# Patient Record
Sex: Female | Born: 2000 | Race: White | Hispanic: No | Marital: Single | Smoking: Never smoker
Health system: Southern US, Community
[De-identification: ages and names within clinical notes are randomized; demographics above are authoritative.]

## PROBLEM LIST (undated history)

## (undated) DIAGNOSIS — J45909 Unspecified asthma, uncomplicated: Secondary | ICD-10-CM

---
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
## 2021-01-01 DIAGNOSIS — Z419 Encounter for procedure for purposes other than remedying health state, unspecified: Secondary | ICD-10-CM | POA: Diagnosis not present

## 2021-01-29 DIAGNOSIS — Z419 Encounter for procedure for purposes other than remedying health state, unspecified: Secondary | ICD-10-CM | POA: Diagnosis not present

## 2021-03-01 DIAGNOSIS — Z419 Encounter for procedure for purposes other than remedying health state, unspecified: Secondary | ICD-10-CM | POA: Diagnosis not present

## 2021-03-31 DIAGNOSIS — Z419 Encounter for procedure for purposes other than remedying health state, unspecified: Secondary | ICD-10-CM | POA: Diagnosis not present

## 2021-05-01 DIAGNOSIS — Z419 Encounter for procedure for purposes other than remedying health state, unspecified: Secondary | ICD-10-CM | POA: Diagnosis not present

## 2021-05-31 DIAGNOSIS — Z419 Encounter for procedure for purposes other than remedying health state, unspecified: Secondary | ICD-10-CM | POA: Diagnosis not present

## 2021-06-28 DIAGNOSIS — B373 Candidiasis of vulva and vagina: Secondary | ICD-10-CM | POA: Diagnosis not present

## 2021-07-01 DIAGNOSIS — Z419 Encounter for procedure for purposes other than remedying health state, unspecified: Secondary | ICD-10-CM | POA: Diagnosis not present

## 2021-07-08 ENCOUNTER — Other Ambulatory Visit: Payer: Self-pay

## 2021-07-08 ENCOUNTER — Emergency Department
Admission: EM | Admit: 2021-07-08 | Discharge: 2021-07-08 | Disposition: A | Discharge status: Home / Self Care | Payer: Medicaid Other | Attending: Emergency Medicine | Admitting: Emergency Medicine

## 2021-07-08 DIAGNOSIS — Z5321 Procedure and treatment not carried out due to patient leaving prior to being seen by health care provider: Secondary | ICD-10-CM | POA: Diagnosis not present

## 2021-07-08 DIAGNOSIS — J029 Acute pharyngitis, unspecified: Secondary | ICD-10-CM | POA: Insufficient documentation

## 2021-07-08 DIAGNOSIS — R509 Fever, unspecified: Secondary | ICD-10-CM | POA: Diagnosis present

## 2021-07-08 NOTE — ED Notes (Signed)
Pt up to desk stating she does not want to wait any longer, wants to go to UC in the morning. Discussed with pt she may return at any point if she wishes. Seen ambulatory through lobby doors.

## 2021-07-08 NOTE — ED Notes (Signed)
Pt refusing tylenol at this time due to throat being too uncomfortable to swallow.

## 2021-07-08 NOTE — ED Triage Notes (Signed)
Pt states starting yesterday she noticed swelling to her right tonsil and has a sore throat, pt states seh has also had a fever at home. Pt denies cough or shortness of breath. Denies any known sick contacts.

## 2021-08-01 DIAGNOSIS — Z419 Encounter for procedure for purposes other than remedying health state, unspecified: Secondary | ICD-10-CM | POA: Diagnosis not present

## 2021-08-31 DIAGNOSIS — Z419 Encounter for procedure for purposes other than remedying health state, unspecified: Secondary | ICD-10-CM | POA: Diagnosis not present

## 2021-10-01 DIAGNOSIS — Z419 Encounter for procedure for purposes other than remedying health state, unspecified: Secondary | ICD-10-CM | POA: Diagnosis not present

## 2021-10-18 DIAGNOSIS — M791 Myalgia, unspecified site: Secondary | ICD-10-CM | POA: Diagnosis not present

## 2021-10-18 DIAGNOSIS — A084 Viral intestinal infection, unspecified: Secondary | ICD-10-CM | POA: Diagnosis not present

## 2021-10-22 DIAGNOSIS — R300 Dysuria: Secondary | ICD-10-CM | POA: Diagnosis not present

## 2021-10-31 DIAGNOSIS — Z419 Encounter for procedure for purposes other than remedying health state, unspecified: Secondary | ICD-10-CM | POA: Diagnosis not present

## 2021-11-10 ENCOUNTER — Encounter: Payer: Self-pay | Admitting: Emergency Medicine

## 2021-11-10 ENCOUNTER — Emergency Department
Admission: EM | Admit: 2021-11-10 | Discharge: 2021-11-10 | Disposition: A | Discharge status: Home / Self Care | Payer: Medicaid Other | Attending: Emergency Medicine | Admitting: Emergency Medicine

## 2021-11-10 ENCOUNTER — Other Ambulatory Visit: Payer: Self-pay

## 2021-11-10 ENCOUNTER — Emergency Department: Payer: Medicaid Other

## 2021-11-10 DIAGNOSIS — M795 Residual foreign body in soft tissue: Secondary | ICD-10-CM

## 2021-11-10 DIAGNOSIS — J45909 Unspecified asthma, uncomplicated: Secondary | ICD-10-CM | POA: Insufficient documentation

## 2021-11-10 DIAGNOSIS — Z975 Presence of (intrauterine) contraceptive device: Secondary | ICD-10-CM | POA: Diagnosis not present

## 2021-11-10 MED ORDER — HYDRALAZINE HCL 50 MG PO TABS: 50.0000 mg | ORAL_TABLET | Freq: Once | ORAL | Status: DC

## 2021-11-10 NOTE — ED Provider Notes (Signed)
[facility] Emergency Department Provider Note   ____________________________________________    I have reviewed the triage vital signs and the nursing notes.   HISTORY  Chief Complaint Foreign Body     HPI Summer Boyd is a 20 y.o. female patient reports she has an Implanon in her left upper extremity, she reports pain in that area today and is concerned that it is broken.  She denies trauma to the area.  Was implanted by the health department.  She reports it is sore, no redness, no bleeding.  Past Medical History:  Diagnosis Date   Asthma     Patient Active Problem List   Diagnosis Date Noted   Asthma 09/09/2019    History reviewed. No pertinent surgical history.  Prior to Admission medications   Medication Sig Start Date End Date Taking? Authorizing Provider  cyclobenzaprine (FLEXERIL) 5 MG tablet Take 1 tablet (5 mg total) by mouth 3 (three) times daily as needed for muscle spasms. Patient not taking: Reported on 09/08/2019 06/28/16   Joni Reining, PA-C  ibuprofen (ADVIL,MOTRIN) 600 MG tablet Take 1 tablet (600 mg total) by mouth every 6 (six) hours as needed for moderate pain. Patient not taking: Reported on 09/08/2019 02/11/17   Evon Slack, PA-C     Allergies Patient has no known allergies.  No family history on file.  Social History Social History   Tobacco Use   Smoking status: Never   Smokeless tobacco: Never  Vaping Use   Vaping Use: Every day  Substance Use Topics   Alcohol use: Yes   Drug use: No    Review of Systems  Constitutional: No fever/chills   Musculoskeletal: As above Skin: Negative for rash.     ____________________________________________   PHYSICAL EXAM:  VITAL SIGNS: ED Triage Vitals  Enc Vitals Group     BP 11/10/21 1708 112/70     Pulse Rate 11/10/21 1708 (!) 112     Resp 11/10/21 1708 18     Temp 11/10/21 1708 98.4 F (36.9 C)     Temp Source 11/10/21 1708 Oral     SpO2  11/10/21 1708 100 %     Weight 11/10/21 1709 58.5 kg (129 lb)     Height 11/10/21 1709 1.6 m (5\' 3" )     Head Circumference --      Peak Flow --      Pain Score 11/10/21 1709 6     Pain Loc --      Pain Edu? --      Excl. in GC? --      Constitutional: Alert and oriented. No acute distress. Pleasant and interactive a. Mouth/Throat: Mucous membranes are moist.   Cardiovascular: Normal rate, regular rhythm.  Respiratory: Normal respiratory effort.  No retractions. Genitourinary: deferred Musculoskeletal: Implanon palpated, does not appear fractured nor abnormal on exam.,  Normal range of motion of the upper extremity, warm and well perfused Neurologic:  Normal speech and language. No gross focal neurologic deficits are appreciated.   Skin:  Skin is warm, dry and intact. No rash noted.   ____________________________________________   LABS (all labs ordered are listed, but only abnormal results are displayed)  Labs Reviewed - No data to display ____________________________________________  EKG   ____________________________________________  RADIOLOGY  Ultrasound reviewed by me, no evidence of abnormality with Implanon ____________________________________________   PROCEDURES  Procedure(s) performed: No  Procedures   Critical Care performed: No ____________________________________________   INITIAL IMPRESSION / ASSESSMENT AND  PLAN / ED COURSE  Pertinent labs & imaging results that were available during my care of the patient were reviewed by me and considered in my medical decision making (see chart for details).   Patient well-appearing and in no acute distress, reassuring exam, recommend follow-up with health department if she wants Implanon removed   ____________________________________________   FINAL CLINICAL IMPRESSION(S) / ED DIAGNOSES  Final diagnoses:  Foreign body (FB) in soft tissue      NEW MEDICATIONS STARTED DURING THIS  VISIT:  Discharge Medication List as of 11/10/2021  6:10 PM       Note:  This document was prepared using Dragon voice recognition software and may include unintentional dictation errors.    Jene Every, MD 11/10/21 [phone]

## 2021-11-10 NOTE — Discharge Instructions (Addendum)
Your implanon did not appear broken on ultrasound

## 2021-11-10 NOTE — ED Triage Notes (Signed)
Pt in via POV, reports noticing today that her Nexplenon Implant is broken in half; reports pain to upper left arm, no swelling noted in triage.  NAD noted at this time.

## 2021-11-10 NOTE — ED Provider Notes (Signed)
  Emergency Medicine Provider Triage Evaluation Note  Summer Boyd , a 20 y.o.female,  was evaluated in triage.  Pt complains of Nexplanon complication.  Patient states that she believes that her Nexplanon  broke in half.  She is currently endorsing severe pain in her left upper extremity.  Tachycardic at 112.  No other symptoms at this time   Review of Systems  Positive: Left upper extremity pain. Negative: Denies fever, chest pain, vomiting  Physical Exam   Vitals:   11/10/21 1708  BP: 112/70  Pulse: (!) 112  Resp: 18  Temp: 98.4 F (36.9 C)  SpO2: 100%   Gen:   Awake, appears to be in pain Resp:  Normal effort  MSK:   Moves extremities without difficulty  Other:    Medical Decision Making  Given the patient's initial medical screening exam, the following diagnostic evaluation has been ordered. The patient will be placed in the appropriate treatment space, once one is available, to complete the evaluation and treatment. I have discussed the plan of care with the patient and I have advised the patient that an ED physician or mid-level practitioner will reevaluate their condition after the test results have been received, as the results may give them additional insight into the type of treatment they may need.    Diagnostics: Ultrasound soft tissue left upper extremity  Treatments: none immediately    Varney Daily, PA 11/10/21 1719    Merwyn Katos, MD 11/10/21 1721

## 2021-11-11 ENCOUNTER — Telehealth: Payer: Self-pay | Admitting: Family Medicine

## 2021-11-11 NOTE — Telephone Encounter (Signed)
Pt states shes in pain from the nexplanon

## 2021-11-11 NOTE — Telephone Encounter (Signed)
Pt thinks that her Nexplanon is broken and she is having pain at the site.   Pt was seen at the ER on 12/11 and an Ultrasound was ordered.  The Nexplanon is not broken.  Per Providers, pt informed to make an appointment.

## 2021-11-13 DIAGNOSIS — J069 Acute upper respiratory infection, unspecified: Secondary | ICD-10-CM | POA: Diagnosis not present

## 2021-11-13 DIAGNOSIS — Z20822 Contact with and (suspected) exposure to covid-19: Secondary | ICD-10-CM | POA: Diagnosis not present

## 2021-11-20 ENCOUNTER — Ambulatory Visit: Payer: Medicaid Other

## 2021-11-28 ENCOUNTER — Ambulatory Visit: Payer: Medicaid Other | Admitting: Nurse Practitioner

## 2021-11-28 ENCOUNTER — Ambulatory Visit: Payer: Medicaid Other

## 2021-11-28 ENCOUNTER — Other Ambulatory Visit: Payer: Self-pay

## 2021-11-28 ENCOUNTER — Ambulatory Visit (LOCAL_COMMUNITY_HEALTH_CENTER): Payer: Medicaid Other | Admitting: Nurse Practitioner

## 2021-11-28 VITALS — BP 104/68 | Ht 64.0 in | Wt 130.8 lb

## 2021-11-28 DIAGNOSIS — Z3009 Encounter for other general counseling and advice on contraception: Secondary | ICD-10-CM | POA: Diagnosis not present

## 2021-11-28 DIAGNOSIS — Z30011 Encounter for initial prescription of contraceptive pills: Secondary | ICD-10-CM

## 2021-11-28 DIAGNOSIS — F419 Anxiety disorder, unspecified: Secondary | ICD-10-CM | POA: Insufficient documentation

## 2021-11-28 MED ORDER — NORGESTIM-ETH ESTRAD TRIPHASIC 0.18/0.215/0.25 MG-35 MCG PO TABS: 1.0000 | ORAL_TABLET | Freq: Every day | ORAL | 2 refills | Status: AC

## 2021-11-29 NOTE — Progress Notes (Signed)
Nexplanon Removal ?Patient identified, informed consent performed, consent signed.   Appropriate time out taken. Nexplanon site identified.  Area prepped in usual sterile fashon. 3 ml of 1% lidocaine with Epinephrine was used to anesthetize the area at the distal end of the implant and along implant site. A small stab incision was made right beside the implant on the distal portion.  The Nexplanon rod was grasped using hemostats/manual and removed without difficulty.  There was minimal blood loss. There were no complications.  Steri-strips were applied over the small incision.  A pressure bandage was applied to reduce any bruising.  The patient tolerated the procedure well and was given post procedure instructions.   Rennae Ferraiolo, FNP  ?

## 2021-11-29 NOTE — Progress Notes (Deleted)
[facility] DEPARTMENT [facility] [address] Main Number: [phone]    Family Planning Visit- Initial Visit  Subjective:  Summer Boyd is a 20 y.o.  [id number]   being seen today for an initial annual visit and to discuss reproductive life planning.  The patient is currently using Hormonal Implant for pregnancy prevention. Patient reports   does not want a pregnancy in the next year.  Patient has the following medical conditions has Asthma and Anxiety on their problem list.  Chief Complaint  Patient presents with   Contraception    Patient reports to clinic today for a physical and Nexplanon removal.  Patient states that she suffers from anxiety and as a coping mechanism she continuously touches and bends the Nexplanon device in her arm.  Device at times is sore and causes some discomfort.  Patient had an ED visit on 11/10/21 to have a U/S of her arm to see if the Nexplanon device was broken.  Per U/S report on 11/10/21 the Nexplanon device was intact.  Patient desires current device to be removed and a new one to be placed.    Patient denies signs and symptoms and declines all blood work.    Body mass index is 22.45 kg/m. - Patient is eligible for diabetes screening based on BMI and age >30?  not applicable HA1C ordered? not applicable  Patient reports 1  partner/s in last year. Desires STI screening?  No - patient declines  Has patient been screened once for HCV in the past?  No  No results found for: HCVAB  Does the patient have current drug use (including MJ), have a partner with drug use, and/or has been incarcerated since last result? No  If yes-- Screen for HCV through [facility]   Does the patient meet criteria for HBV testing? No  Criteria:  -Household, sexual or needle sharing contact with HBV -History of drug use -HIV positive -Those with known Hep C   Health Maintenance Due  Topic Date Due   Pneumococcal Vaccine 69-17  Years old (1 - PCV) Never done   HPV VACCINES (1 - 2-dose series) Never done   CHLAMYDIA SCREENING  Never done   HIV Screening  Never done   Hepatitis C Screening  Never done   INFLUENZA VACCINE  07/01/2021    Review of Systems  Constitutional:  Negative for chills, fever, malaise/fatigue and weight loss.  HENT:  Negative for congestion, hearing loss and sore throat.   Eyes:  Negative for blurred vision, double vision and photophobia.  Respiratory:  Negative for shortness of breath.   Cardiovascular:  Negative for chest pain.  Gastrointestinal:  Negative for abdominal pain, blood in stool, constipation, diarrhea, heartburn, nausea and vomiting.  Genitourinary:  Negative for dysuria and frequency.  Musculoskeletal:  Negative for back pain, joint pain and neck pain.  Skin:  Negative for itching and rash.  Neurological:  Negative for dizziness, weakness and headaches.  Endo/Heme/Allergies:  Does not bruise/bleed easily.  Psychiatric/Behavioral:  Negative for depression, substance abuse and suicidal ideas.    The following portions of the patient's history were reviewed and updated as appropriate: allergies, current medications, past family history, past medical history, past social history, past surgical history and problem list. Problem list updated.   See flowsheet for other program required questions.  Objective:   Vitals:   11/29/21 1404  BP: 104/68  Weight: 130 lb 12.8 oz (59.3 kg)  Height: 5\' 4"  (1.626  m)    Physical Exam Vitals and nursing note reviewed.  Constitutional:      Appearance: Normal appearance.  HENT:     Head: Normocephalic and atraumatic.     Right Ear: Tympanic membrane normal.     Left Ear: Tympanic membrane normal.     Mouth/Throat:     Mouth: Mucous membranes are moist.     Pharynx: No oropharyngeal exudate or posterior oropharyngeal erythema.     Comments: No visible dental caries.  Tonsils enlarged bilaterally.  Eyes:     General: No scleral  icterus.    Pupils: Pupils are equal, round, and reactive to light.  Cardiovascular:     Rate and Rhythm: Normal rate and regular rhythm.     Pulses: Normal pulses.  Pulmonary:     Effort: Pulmonary effort is normal.     Breath sounds: Normal breath sounds.  Abdominal:     General: Abdomen is flat. Bowel sounds are normal.     Palpations: Abdomen is soft.  Musculoskeletal:        General: Normal range of motion.     Cervical back: Full passive range of motion without pain and normal range of motion.  Skin:    General: Skin is warm and dry.  Neurological:     General: No focal deficit present.     Mental Status: She is alert and oriented to person, place, and time.  Psychiatric:        Behavior: Behavior is cooperative.      Assessment and Plan:  ARYN SAFRAN is a 20 y.o. female presenting to the [facility] Department for an initial annual wellness/contraceptive visit  Contraception counseling: Reviewed all forms of birth control options in the tiered based approach. available including abstinence; over the counter/barrier methods; hormonal contraceptive medication including pill, patch, ring, injection,contraceptive implant, ECP; hormonal and nonhormonal IUDs; permanent sterilization options including vasectomy and the various tubal sterilization modalities. Risks, benefits, and typical effectiveness rates were reviewed.  Questions were answered.  Written information was also given to the patient to review.  Patient desires Oral Contraceptive, this was prescribed for patient.    The patient will follow up in  1 year for surveillance.  The patient was told to call with any further questions, or with any concerns about this method of contraception.  Emphasized use of condoms 100% of the time for STI prevention.  Patient was not offered ECP based on current use of contraception.    There are no diagnoses linked to this encounter.    Return in about 1 year (around  11/28/2022).  No future appointments.  Glenna Fellows, FNP

## 2021-11-29 NOTE — Progress Notes (Signed)
[facility] DEPARTMENT [facility] [address] Main Number: [phone]    Family Planning Visit- Initial Visit  Subjective:  Summer Boyd is a 20 y.o.  [id number]   being seen today for an initial annual visit and to discuss reproductive life planning.  The patient is currently using Hormonal Implant for pregnancy prevention. Patient reports   does not want a pregnancy in the next year.  Patient has the following medical conditions has Asthma and Anxiety on their problem list.  Chief Complaint  Patient presents with   Contraception    PE, Nexplanon removal and reinsertion       Patient reports to clinic today for a physical and Nexplanon removal.  Patient states that she suffers from anxiety and as a coping mechanism she continuously touches and bends the Nexplanon device in her arm.  Device at times is sore and causes some discomfort.  Patient had an ED visit on 11/10/21 to have a U/S of her arm to see if the Nexplanon device was broken.  Per U/S report on 11/10/21 the Nexplanon device was intact.  Patient desires current device to be removed and a new one to be placed.    Patient denies any other signs and symptoms.  Declines blood work today and STD screening.    Body mass index is 22.45 kg/m. - Patient is eligible for diabetes screening based on BMI and age >45?  not applicable HA1C ordered? not applicable  Patient reports 1  partner/s in last year. Desires STI screening?  No - patient declines   Has patient been screened once for HCV in the past?  No  No results found for: HCVAB  Does the patient have current drug use (including MJ), have a partner with drug use, and/or has been incarcerated since last result? No  If yes-- Screen for HCV through [facility]   Does the patient meet criteria for HBV testing? No  Criteria:  -Household, sexual or needle sharing contact with HBV -History of drug use -HIV positive -Those with known Hep  C   Health Maintenance Due  Topic Date Due   Pneumococcal Vaccine 27-11 Years old (1 - PCV) Never done   HPV VACCINES (1 - 2-dose series) Never done   CHLAMYDIA SCREENING  Never done   HIV Screening  Never done   Hepatitis C Screening  Never done   INFLUENZA VACCINE  07/01/2021    Review of Systems  Constitutional:  Negative for chills, fever, malaise/fatigue and weight loss.  HENT:  Negative for congestion, hearing loss and sore throat.   Eyes:  Negative for blurred vision, double vision and photophobia.  Respiratory:  Negative for shortness of breath.   Cardiovascular:  Negative for chest pain.  Gastrointestinal:  Negative for abdominal pain, blood in stool, constipation, diarrhea, heartburn, nausea and vomiting.  Genitourinary:  Negative for dysuria and frequency.  Musculoskeletal:  Negative for back pain, joint pain and neck pain.  Skin:  Negative for itching and rash.  Neurological:  Negative for dizziness, weakness and headaches.  Endo/Heme/Allergies:  Does not bruise/bleed easily.  Psychiatric/Behavioral:  Negative for depression, substance abuse and suicidal ideas.    The following portions of the patient's history were reviewed and updated as appropriate: allergies, current medications, past family history, past medical history, past social history, past surgical history and problem list. Problem list updated.   See flowsheet for other program required questions.  Objective:   Vitals:   11/29/21  0940  BP: 104/68  Weight: 130 lb 12.8 oz (59.3 kg)  Height: 5\' 4"  (1.626 m)    Physical Exam Vitals and nursing note reviewed.  Constitutional:      Appearance: Normal appearance.  HENT:     Head: Normocephalic and atraumatic.     Mouth/Throat:     Mouth: Mucous membranes are moist.     Pharynx: No oropharyngeal exudate or posterior oropharyngeal erythema.     Comments: Enlarged tonsils bilaterally.   No dental caries noted.  Eyes:     General: No scleral  icterus.    Pupils: Pupils are equal, round, and reactive to light.  Cardiovascular:     Rate and Rhythm: Normal rate and regular rhythm.     Pulses: Normal pulses.  Pulmonary:     Effort: Pulmonary effort is normal.     Breath sounds: Normal breath sounds.  Abdominal:     General: Abdomen is flat. Bowel sounds are normal.     Palpations: Abdomen is soft.  Musculoskeletal:        General: Normal range of motion.     Cervical back: Full passive range of motion without pain and normal range of motion.  Skin:    General: Skin is warm and dry.  Neurological:     General: No focal deficit present.     Mental Status: She is alert and oriented to person, place, and time.  Psychiatric:        Behavior: Behavior is cooperative.      Assessment and Plan:  Summer Boyd is a 20 y.o. female presenting to the [facility] Department for an initial annual wellness/contraceptive visit  Contraception counseling: Reviewed all forms of birth control options in the tiered based approach. available including abstinence; over the counter/barrier methods; hormonal contraceptive medication including pill, patch, ring, injection,contraceptive implant, ECP; hormonal and nonhormonal IUDs; permanent sterilization options including vasectomy and the various tubal sterilization modalities. Risks, benefits, and typical effectiveness rates were reviewed.  Questions were answered.  Written information was also given to the patient to review.  Patient desires Oral Contraceptive, this was prescribed for patient.    The patient will follow up in  1 year  for surveillance.  The patient was told to call with any further questions, or with any concerns about this method of contraception.  Emphasized use of condoms 100% of the time for STI prevention.  Patient was not offered offered ECP based on current contraception method.     1. Family planning -20 year old female in clinic today for a well woman exam.  Patient desires to switch birth control methods. Desires to have Nexplanon removed today and start birth control pills after further dicussion. See Nexplanon flowsheet and note.    2. Anxiety -Patient given coping strategies to manage anxiety and behavioral health referral submitted today.  - Ambulatory referral to Behavioral Health  3. Encounter for initial prescription of contraceptive pills - Prescri[ption sent to pharmacy for Tri-Sprintec.  Patient advised to use condoms with sex for 2 weeks due to switching to a new method.  Three packs given today.  Advised to return to clinic when on last pack of pills to assess compliance and how method is working for patient.   - Norgestimate-Ethinyl Estradiol Triphasic (TRI-SPRINTEC) 0.18/0.215/0.25 MG-35 MCG tablet; Take 1 tablet by mouth daily.  Dispense: 28 tablet; Refill: 2    Return in about 1 year (around 11/28/2022) for Annual well-woman exam.    11/30/2022,  FNP

## 2021-12-01 DIAGNOSIS — Z419 Encounter for procedure for purposes other than remedying health state, unspecified: Secondary | ICD-10-CM | POA: Diagnosis not present

## 2022-01-01 DIAGNOSIS — Z419 Encounter for procedure for purposes other than remedying health state, unspecified: Secondary | ICD-10-CM | POA: Diagnosis not present

## 2022-01-29 DIAGNOSIS — Z419 Encounter for procedure for purposes other than remedying health state, unspecified: Secondary | ICD-10-CM | POA: Diagnosis not present

## 2022-02-25 DIAGNOSIS — B3731 Acute candidiasis of vulva and vagina: Secondary | ICD-10-CM | POA: Diagnosis not present

## 2022-03-01 DIAGNOSIS — Z419 Encounter for procedure for purposes other than remedying health state, unspecified: Secondary | ICD-10-CM | POA: Diagnosis not present

## 2022-03-31 DIAGNOSIS — Z419 Encounter for procedure for purposes other than remedying health state, unspecified: Secondary | ICD-10-CM | POA: Diagnosis not present

## 2022-05-01 DIAGNOSIS — Z419 Encounter for procedure for purposes other than remedying health state, unspecified: Secondary | ICD-10-CM | POA: Diagnosis not present

## 2022-05-31 DIAGNOSIS — Z419 Encounter for procedure for purposes other than remedying health state, unspecified: Secondary | ICD-10-CM | POA: Diagnosis not present

## 2022-06-07 DIAGNOSIS — L729 Follicular cyst of the skin and subcutaneous tissue, unspecified: Secondary | ICD-10-CM | POA: Diagnosis not present

## 2022-06-07 DIAGNOSIS — R110 Nausea: Secondary | ICD-10-CM | POA: Diagnosis not present

## 2022-06-07 DIAGNOSIS — R630 Anorexia: Secondary | ICD-10-CM | POA: Diagnosis not present

## 2022-06-17 DIAGNOSIS — Z3202 Encounter for pregnancy test, result negative: Secondary | ICD-10-CM | POA: Diagnosis not present

## 2022-06-17 DIAGNOSIS — R197 Diarrhea, unspecified: Secondary | ICD-10-CM | POA: Diagnosis not present

## 2022-06-25 IMAGING — US US EXTREM UP*L* LTD
1 series · 14 of 25 positions shown · non-contrast
Comparison: None.

CLINICAL DATA: Implanted contraceptive device with possible injury

EXAM:
ULTRASOUND LEFT UPPER EXTREMITY LIMITED
TECHNIQUE: Ultrasound examination of the upper extremity soft tissues was
performed in the area of clinical concern.

[Series 1: us soft tissue upper extremity limited left (non-v · 27 acquisitions, 14 frames shown]
[im 1/27]
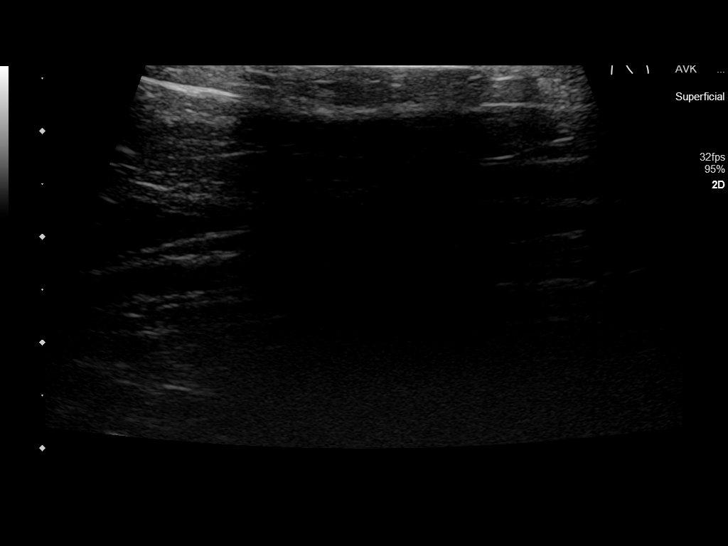
[im 3/27]
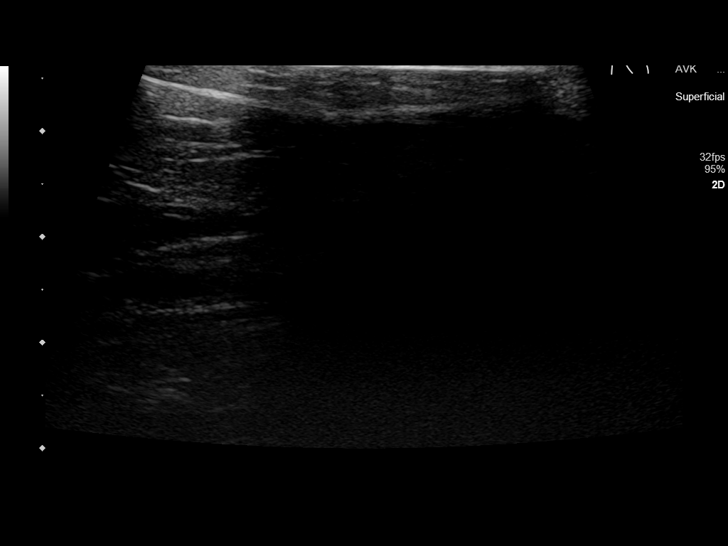
[im 5/27]
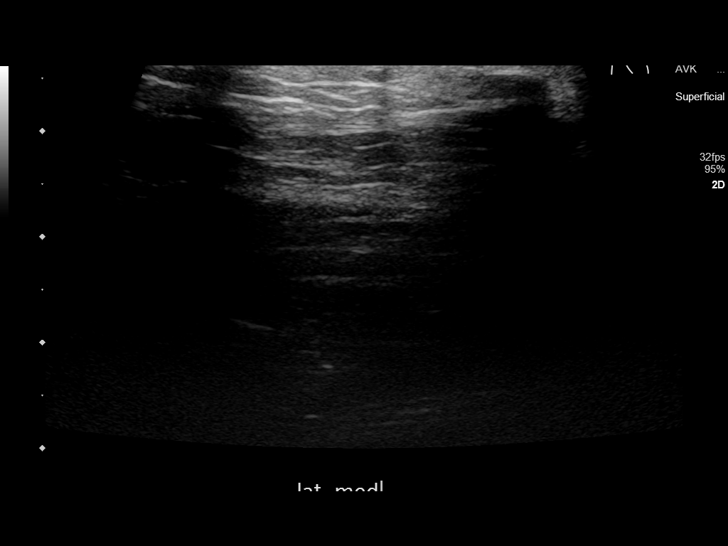
[im 7/27]
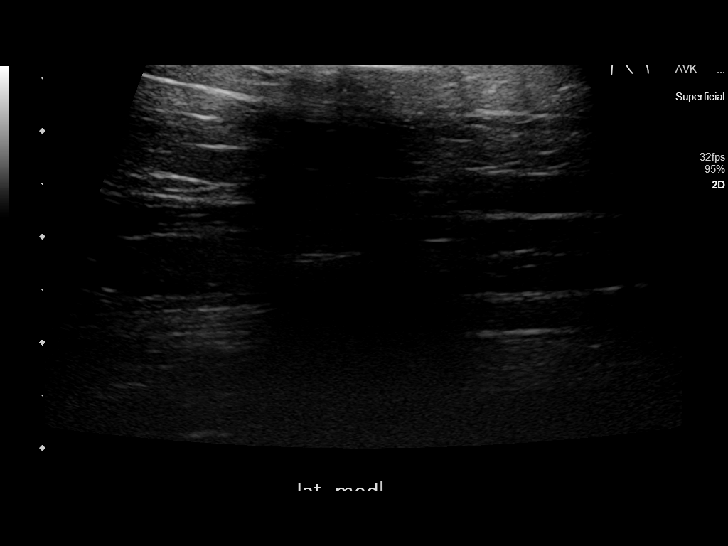
[im 9/27]
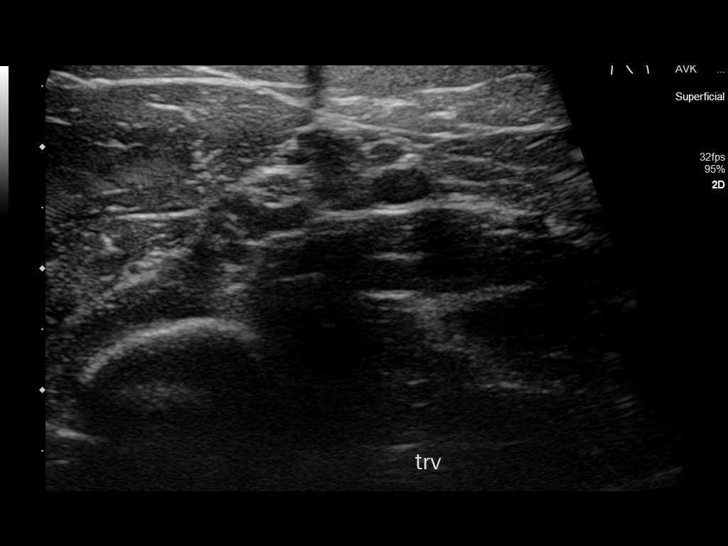
[im 10/27]
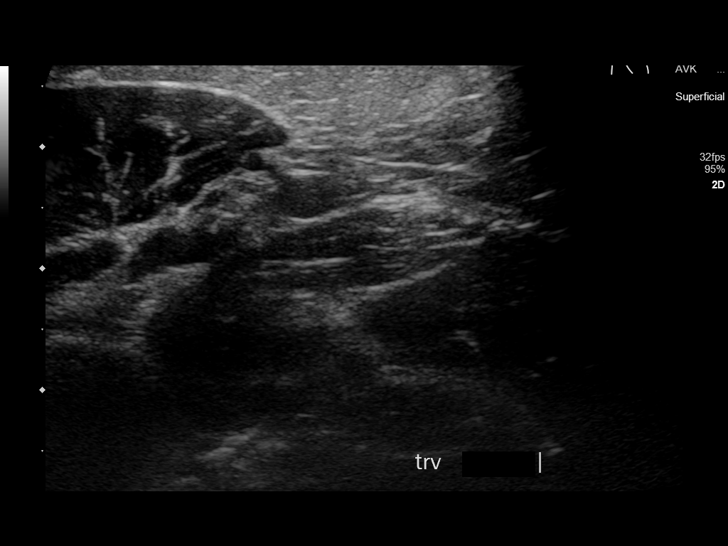
[im 12/27]
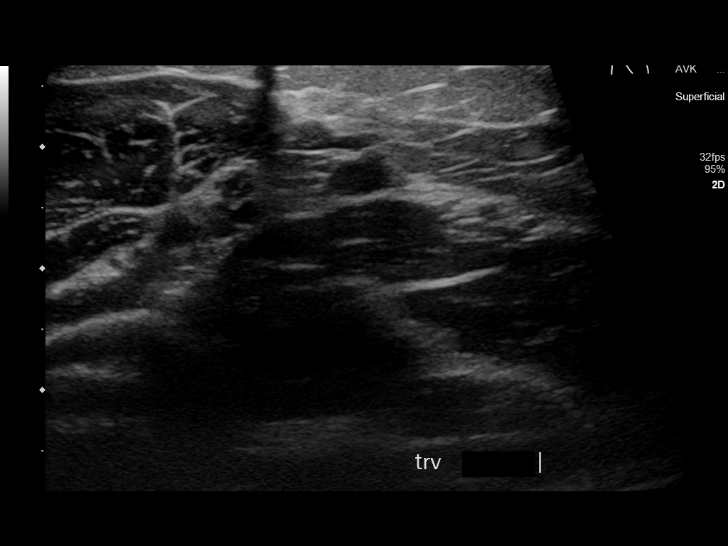
[im 15/27]
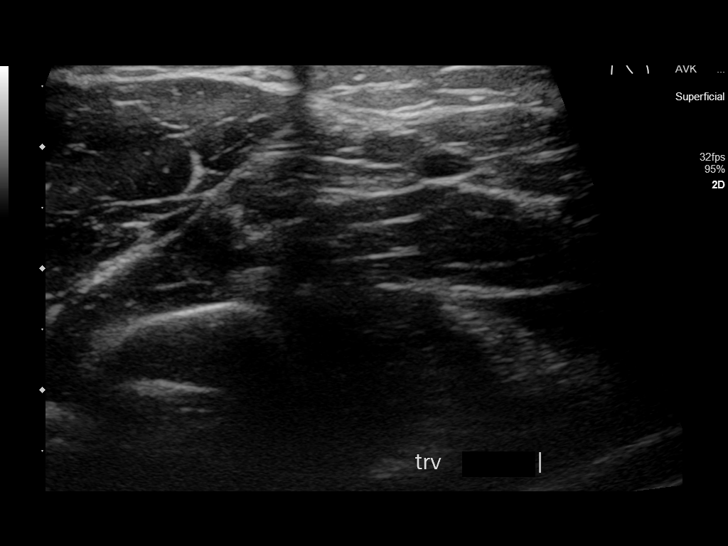
[im 17/27]
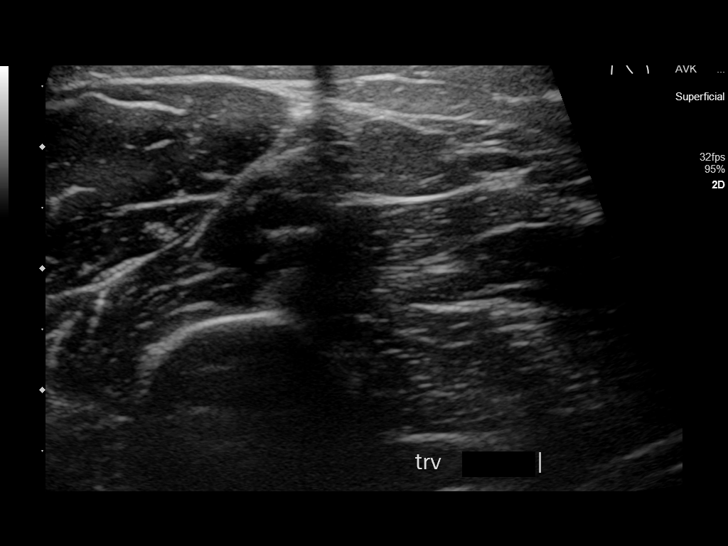
[im 18/27]
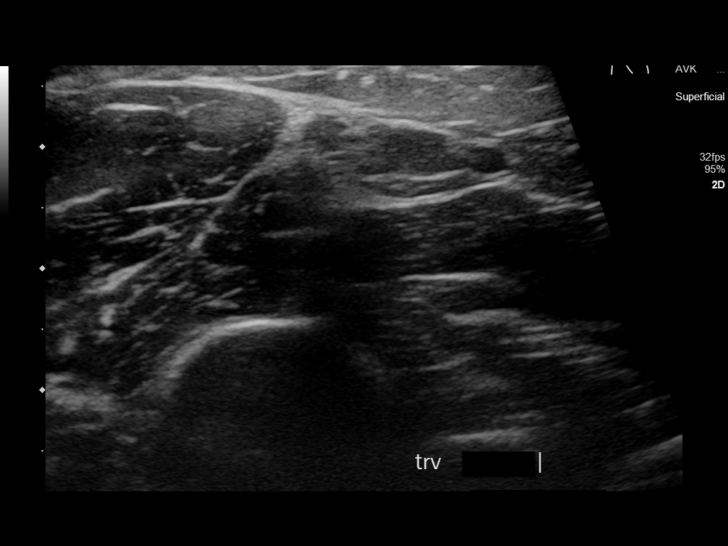
[im 20/27]
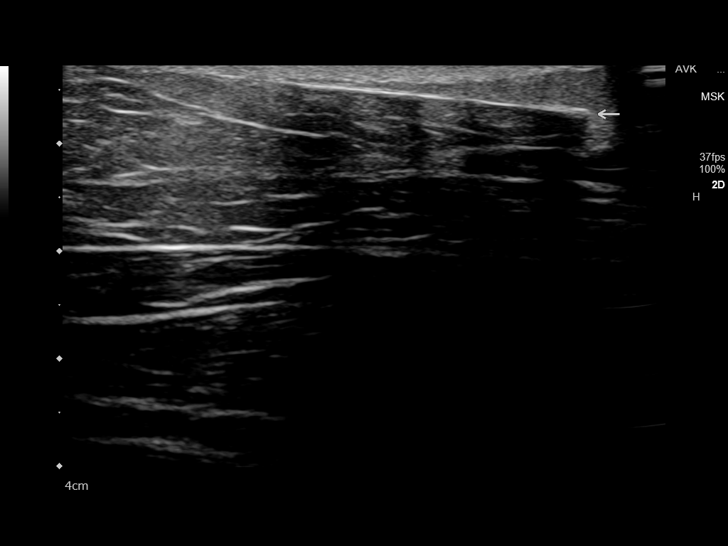
[im 22/27]
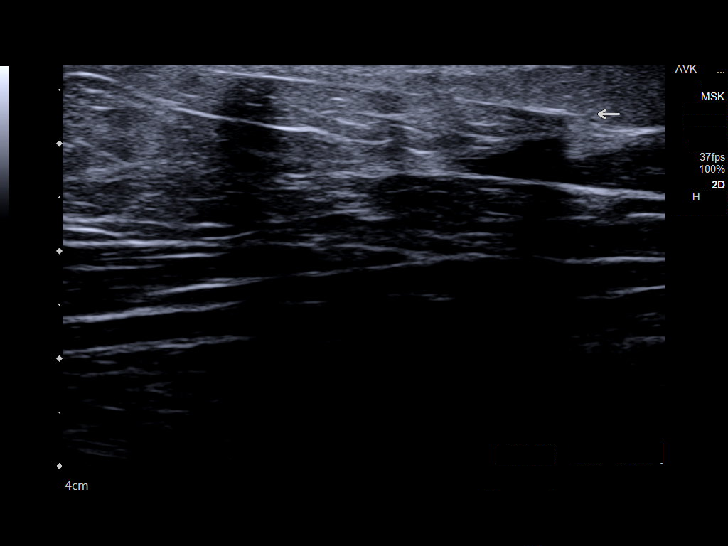
[im 24/27]
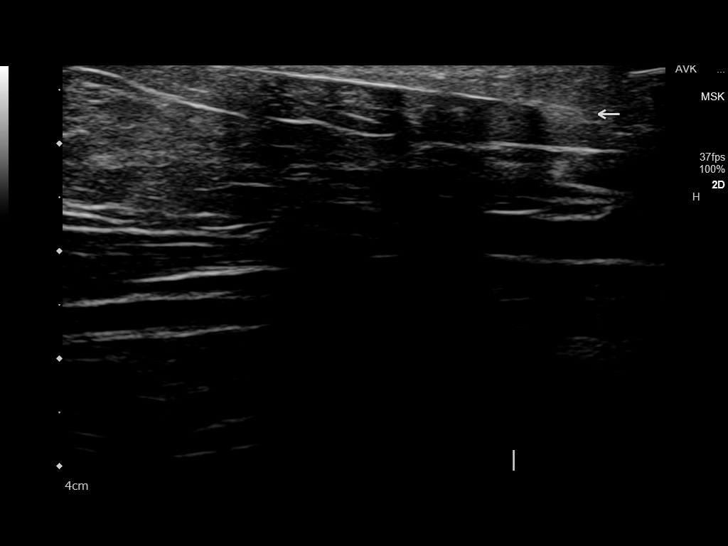
[im 27/27]
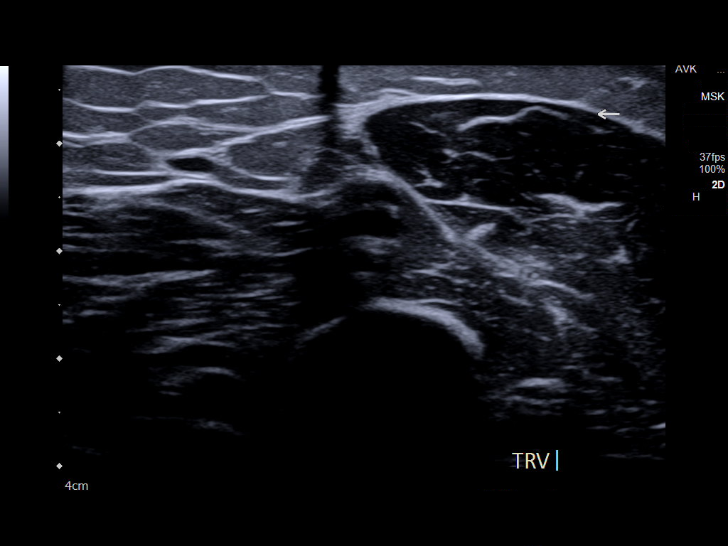

[14 of 25 positions shown; findings below may reference images not displayed]

FINDINGS: Targeted ultrasound was performed of the soft tissues of the left
upper extremity at site of patient's clinical concern. Linear
echogenic structure within the subcutaneous soft tissues at site of
patient's clinical concern compatible with implanted contraceptive
device. No obvious discontinuity. No surrounding soft tissue edema
or fluid.
IMPRESSION: Implanted contraceptive device identified within the subcutaneous
tissues of the left upper extremity. No obvious discontinuity. No
surrounding edema or fluid. If there is persistent concern for
device fracture, consider radiography.

## 2022-07-01 DIAGNOSIS — Z419 Encounter for procedure for purposes other than remedying health state, unspecified: Secondary | ICD-10-CM | POA: Diagnosis not present

## 2022-07-10 DIAGNOSIS — H60392 Other infective otitis externa, left ear: Secondary | ICD-10-CM | POA: Diagnosis not present

## 2022-08-01 DIAGNOSIS — Z419 Encounter for procedure for purposes other than remedying health state, unspecified: Secondary | ICD-10-CM | POA: Diagnosis not present

## 2022-08-12 DIAGNOSIS — Z20822 Contact with and (suspected) exposure to covid-19: Secondary | ICD-10-CM | POA: Diagnosis not present

## 2022-08-12 DIAGNOSIS — R197 Diarrhea, unspecified: Secondary | ICD-10-CM | POA: Diagnosis not present

## 2022-08-14 DIAGNOSIS — R300 Dysuria: Secondary | ICD-10-CM | POA: Diagnosis not present

## 2022-08-14 DIAGNOSIS — N3000 Acute cystitis without hematuria: Secondary | ICD-10-CM | POA: Diagnosis not present

## 2022-08-31 DIAGNOSIS — Z419 Encounter for procedure for purposes other than remedying health state, unspecified: Secondary | ICD-10-CM | POA: Diagnosis not present

## 2022-09-14 DIAGNOSIS — B3731 Acute candidiasis of vulva and vagina: Secondary | ICD-10-CM | POA: Diagnosis not present

## 2022-10-01 DIAGNOSIS — Z419 Encounter for procedure for purposes other than remedying health state, unspecified: Secondary | ICD-10-CM | POA: Diagnosis not present

## 2022-10-31 DIAGNOSIS — Z419 Encounter for procedure for purposes other than remedying health state, unspecified: Secondary | ICD-10-CM | POA: Diagnosis not present

## 2022-12-01 DIAGNOSIS — Z419 Encounter for procedure for purposes other than remedying health state, unspecified: Secondary | ICD-10-CM | POA: Diagnosis not present

## 2023-01-01 DIAGNOSIS — Z419 Encounter for procedure for purposes other than remedying health state, unspecified: Secondary | ICD-10-CM | POA: Diagnosis not present

## 2023-01-27 DIAGNOSIS — R350 Frequency of micturition: Secondary | ICD-10-CM | POA: Diagnosis not present

## 2023-01-27 DIAGNOSIS — J111 Influenza due to unidentified influenza virus with other respiratory manifestations: Secondary | ICD-10-CM | POA: Diagnosis not present

## 2023-01-27 DIAGNOSIS — N390 Urinary tract infection, site not specified: Secondary | ICD-10-CM | POA: Diagnosis not present

## 2023-01-27 DIAGNOSIS — Z20822 Contact with and (suspected) exposure to covid-19: Secondary | ICD-10-CM | POA: Diagnosis not present

## 2023-01-27 DIAGNOSIS — U071 COVID-19: Secondary | ICD-10-CM | POA: Diagnosis not present

## 2023-01-27 DIAGNOSIS — R059 Cough, unspecified: Secondary | ICD-10-CM | POA: Diagnosis not present

## 2023-01-30 DIAGNOSIS — Z419 Encounter for procedure for purposes other than remedying health state, unspecified: Secondary | ICD-10-CM | POA: Diagnosis not present

## 2023-02-24 DIAGNOSIS — N898 Other specified noninflammatory disorders of vagina: Secondary | ICD-10-CM | POA: Diagnosis not present

## 2023-02-27 DIAGNOSIS — N898 Other specified noninflammatory disorders of vagina: Secondary | ICD-10-CM | POA: Diagnosis not present

## 2023-03-02 DIAGNOSIS — Z419 Encounter for procedure for purposes other than remedying health state, unspecified: Secondary | ICD-10-CM | POA: Diagnosis not present

## 2023-03-02 DIAGNOSIS — N898 Other specified noninflammatory disorders of vagina: Secondary | ICD-10-CM | POA: Diagnosis not present

## 2023-03-06 DIAGNOSIS — N898 Other specified noninflammatory disorders of vagina: Secondary | ICD-10-CM | POA: Diagnosis not present

## 2023-03-06 DIAGNOSIS — N949 Unspecified condition associated with female genital organs and menstrual cycle: Secondary | ICD-10-CM | POA: Diagnosis not present

## 2023-03-12 DIAGNOSIS — R112 Nausea with vomiting, unspecified: Secondary | ICD-10-CM | POA: Diagnosis not present

## 2023-04-01 DIAGNOSIS — Z419 Encounter for procedure for purposes other than remedying health state, unspecified: Secondary | ICD-10-CM | POA: Diagnosis not present

## 2023-04-12 DIAGNOSIS — R399 Unspecified symptoms and signs involving the genitourinary system: Secondary | ICD-10-CM | POA: Diagnosis not present

## 2023-04-28 DIAGNOSIS — H9202 Otalgia, left ear: Secondary | ICD-10-CM | POA: Diagnosis not present

## 2023-04-28 DIAGNOSIS — H60502 Unspecified acute noninfective otitis externa, left ear: Secondary | ICD-10-CM | POA: Diagnosis not present

## 2023-04-28 DIAGNOSIS — B3731 Acute candidiasis of vulva and vagina: Secondary | ICD-10-CM | POA: Diagnosis not present

## 2023-05-02 DIAGNOSIS — Z419 Encounter for procedure for purposes other than remedying health state, unspecified: Secondary | ICD-10-CM | POA: Diagnosis not present

## 2023-06-01 DIAGNOSIS — Z419 Encounter for procedure for purposes other than remedying health state, unspecified: Secondary | ICD-10-CM | POA: Diagnosis not present

## 2023-06-07 DIAGNOSIS — R300 Dysuria: Secondary | ICD-10-CM | POA: Diagnosis not present

## 2023-06-07 DIAGNOSIS — N390 Urinary tract infection, site not specified: Secondary | ICD-10-CM | POA: Diagnosis not present

## 2023-07-02 DIAGNOSIS — Z419 Encounter for procedure for purposes other than remedying health state, unspecified: Secondary | ICD-10-CM | POA: Diagnosis not present

## 2023-07-08 DIAGNOSIS — R399 Unspecified symptoms and signs involving the genitourinary system: Secondary | ICD-10-CM | POA: Diagnosis not present

## 2023-08-02 DIAGNOSIS — Z419 Encounter for procedure for purposes other than remedying health state, unspecified: Secondary | ICD-10-CM | POA: Diagnosis not present

## 2023-08-10 DIAGNOSIS — R350 Frequency of micturition: Secondary | ICD-10-CM | POA: Diagnosis not present

## 2023-09-01 DIAGNOSIS — Z419 Encounter for procedure for purposes other than remedying health state, unspecified: Secondary | ICD-10-CM | POA: Diagnosis not present

## 2023-10-02 DIAGNOSIS — Z419 Encounter for procedure for purposes other than remedying health state, unspecified: Secondary | ICD-10-CM | POA: Diagnosis not present

## 2023-10-09 DIAGNOSIS — Z87440 Personal history of urinary (tract) infections: Secondary | ICD-10-CM | POA: Diagnosis not present

## 2023-10-09 DIAGNOSIS — R300 Dysuria: Secondary | ICD-10-CM | POA: Diagnosis not present

## 2023-10-10 DIAGNOSIS — N390 Urinary tract infection, site not specified: Secondary | ICD-10-CM | POA: Diagnosis not present

## 2023-10-10 DIAGNOSIS — R350 Frequency of micturition: Secondary | ICD-10-CM | POA: Diagnosis not present

## 2023-11-01 DIAGNOSIS — Z419 Encounter for procedure for purposes other than remedying health state, unspecified: Secondary | ICD-10-CM | POA: Diagnosis not present

## 2023-12-02 DIAGNOSIS — Z419 Encounter for procedure for purposes other than remedying health state, unspecified: Secondary | ICD-10-CM | POA: Diagnosis not present

## 2024-01-02 DIAGNOSIS — Z419 Encounter for procedure for purposes other than remedying health state, unspecified: Secondary | ICD-10-CM | POA: Diagnosis not present

## 2024-01-18 DIAGNOSIS — Z23 Encounter for immunization: Secondary | ICD-10-CM | POA: Diagnosis not present

## 2024-01-18 DIAGNOSIS — R5383 Other fatigue: Secondary | ICD-10-CM | POA: Diagnosis not present

## 2024-01-18 DIAGNOSIS — F32A Depression, unspecified: Secondary | ICD-10-CM | POA: Diagnosis not present

## 2024-01-18 DIAGNOSIS — F419 Anxiety disorder, unspecified: Secondary | ICD-10-CM | POA: Diagnosis not present

## 2024-01-29 DIAGNOSIS — Z30017 Encounter for initial prescription of implantable subdermal contraceptive: Secondary | ICD-10-CM | POA: Diagnosis not present

## 2024-01-30 DIAGNOSIS — Z419 Encounter for procedure for purposes other than remedying health state, unspecified: Secondary | ICD-10-CM | POA: Diagnosis not present

## 2024-03-01 DIAGNOSIS — R41840 Attention and concentration deficit: Secondary | ICD-10-CM | POA: Diagnosis not present

## 2024-03-01 DIAGNOSIS — F419 Anxiety disorder, unspecified: Secondary | ICD-10-CM | POA: Diagnosis not present

## 2024-03-01 DIAGNOSIS — F32A Depression, unspecified: Secondary | ICD-10-CM | POA: Diagnosis not present

## 2024-03-12 DIAGNOSIS — Z419 Encounter for procedure for purposes other than remedying health state, unspecified: Secondary | ICD-10-CM | POA: Diagnosis not present

## 2024-03-22 DIAGNOSIS — R41840 Attention and concentration deficit: Secondary | ICD-10-CM | POA: Diagnosis not present

## 2024-03-22 DIAGNOSIS — F4010 Social phobia, unspecified: Secondary | ICD-10-CM | POA: Diagnosis not present

## 2024-04-05 DIAGNOSIS — F902 Attention-deficit hyperactivity disorder, combined type: Secondary | ICD-10-CM | POA: Diagnosis not present

## 2024-04-05 DIAGNOSIS — F4010 Social phobia, unspecified: Secondary | ICD-10-CM | POA: Diagnosis not present

## 2024-04-11 DIAGNOSIS — Z419 Encounter for procedure for purposes other than remedying health state, unspecified: Secondary | ICD-10-CM | POA: Diagnosis not present

## 2024-05-06 DIAGNOSIS — F902 Attention-deficit hyperactivity disorder, combined type: Secondary | ICD-10-CM | POA: Diagnosis not present

## 2024-05-12 DIAGNOSIS — Z419 Encounter for procedure for purposes other than remedying health state, unspecified: Secondary | ICD-10-CM | POA: Diagnosis not present

## 2024-06-06 DIAGNOSIS — F419 Anxiety disorder, unspecified: Secondary | ICD-10-CM | POA: Diagnosis not present

## 2024-06-06 DIAGNOSIS — F32A Depression, unspecified: Secondary | ICD-10-CM | POA: Diagnosis not present

## 2024-06-06 DIAGNOSIS — F902 Attention-deficit hyperactivity disorder, combined type: Secondary | ICD-10-CM | POA: Diagnosis not present

## 2024-06-11 DIAGNOSIS — Z419 Encounter for procedure for purposes other than remedying health state, unspecified: Secondary | ICD-10-CM | POA: Diagnosis not present

## 2024-07-12 DIAGNOSIS — Z419 Encounter for procedure for purposes other than remedying health state, unspecified: Secondary | ICD-10-CM | POA: Diagnosis not present

## 2024-08-04 DIAGNOSIS — Z20822 Contact with and (suspected) exposure to covid-19: Secondary | ICD-10-CM | POA: Diagnosis not present

## 2024-08-04 DIAGNOSIS — J069 Acute upper respiratory infection, unspecified: Secondary | ICD-10-CM | POA: Diagnosis not present

## 2024-08-04 DIAGNOSIS — J029 Acute pharyngitis, unspecified: Secondary | ICD-10-CM | POA: Diagnosis not present
